# Patient Record
Sex: Female | Born: 1935 | Race: White | Hispanic: No | Smoking: Never smoker
Health system: Southern US, Community
[De-identification: ages and names within clinical notes are randomized; demographics above are authoritative.]

## PROBLEM LIST (undated history)

## (undated) DIAGNOSIS — D696 Thrombocytopenia, unspecified: Secondary | ICD-10-CM

## (undated) DIAGNOSIS — R000 Tachycardia, unspecified: Secondary | ICD-10-CM

## (undated) DIAGNOSIS — O2230 Deep phlebothrombosis in pregnancy, unspecified trimester: Secondary | ICD-10-CM

## (undated) DIAGNOSIS — M549 Dorsalgia, unspecified: Secondary | ICD-10-CM

## (undated) DIAGNOSIS — G8929 Other chronic pain: Secondary | ICD-10-CM

## (undated) DIAGNOSIS — N390 Urinary tract infection, site not specified: Secondary | ICD-10-CM

## (undated) DIAGNOSIS — K5792 Diverticulitis of intestine, part unspecified, without perforation or abscess without bleeding: Secondary | ICD-10-CM

## (undated) DIAGNOSIS — M1990 Unspecified osteoarthritis, unspecified site: Secondary | ICD-10-CM

## (undated) DIAGNOSIS — Q782 Osteopetrosis: Secondary | ICD-10-CM

## (undated) DIAGNOSIS — I2699 Other pulmonary embolism without acute cor pulmonale: Secondary | ICD-10-CM

## (undated) HISTORY — PX: ABDOMINAL HYSTERECTOMY: SHX81

---

## 2017-06-04 ENCOUNTER — Emergency Department (HOSPITAL_BASED_OUTPATIENT_CLINIC_OR_DEPARTMENT_OTHER): Payer: Medicare Other

## 2017-06-04 ENCOUNTER — Emergency Department (HOSPITAL_BASED_OUTPATIENT_CLINIC_OR_DEPARTMENT_OTHER)
Admission: EM | Admit: 2017-06-04 | Discharge: 2017-06-05 | Disposition: A | Discharge status: Home / Self Care | Payer: Medicare Other | Attending: Emergency Medicine | Admitting: Emergency Medicine

## 2017-06-04 ENCOUNTER — Encounter (HOSPITAL_BASED_OUTPATIENT_CLINIC_OR_DEPARTMENT_OTHER): Payer: Self-pay | Admitting: Emergency Medicine

## 2017-06-04 DIAGNOSIS — Z86711 Personal history of pulmonary embolism: Secondary | ICD-10-CM | POA: Insufficient documentation

## 2017-06-04 DIAGNOSIS — R0602 Shortness of breath: Secondary | ICD-10-CM | POA: Insufficient documentation

## 2017-06-04 DIAGNOSIS — Z79899 Other long term (current) drug therapy: Secondary | ICD-10-CM | POA: Insufficient documentation

## 2017-06-04 DIAGNOSIS — D72819 Decreased white blood cell count, unspecified: Secondary | ICD-10-CM

## 2017-06-04 DIAGNOSIS — R0789 Other chest pain: Secondary | ICD-10-CM | POA: Diagnosis not present

## 2017-06-04 HISTORY — DX: Dorsalgia, unspecified: M54.9

## 2017-06-04 HISTORY — DX: Other chronic pain: G89.29

## 2017-06-04 HISTORY — DX: Unspecified osteoarthritis, unspecified site: M19.90

## 2017-06-04 HISTORY — DX: Tachycardia, unspecified: R00.0

## 2017-06-04 HISTORY — DX: Osteopetrosis: Q78.2

## 2017-06-04 HISTORY — DX: Diverticulitis of intestine, part unspecified, without perforation or abscess without bleeding: K57.92

## 2017-06-04 HISTORY — DX: Urinary tract infection, site not specified: N39.0

## 2017-06-04 HISTORY — DX: Thrombocytopenia, unspecified: D69.6

## 2017-06-04 HISTORY — DX: Other pulmonary embolism without acute cor pulmonale: I26.99

## 2017-06-04 HISTORY — DX: Deep phlebothrombosis in pregnancy, unspecified trimester: O22.30

## 2017-06-04 LAB — BASIC METABOLIC PANEL
ANION GAP: 7 (ref 5–15)
BUN: 18 mg/dL (ref 6–20)
CALCIUM: 10.2 mg/dL (ref 8.9–10.3)
CO2: 29 mmol/L (ref 22–32)
Chloride: 103 mmol/L (ref 101–111)
Creatinine, Ser: 1.01 mg/dL — ABNORMAL HIGH (ref 0.44–1.00)
GFR, EST AFRICAN AMERICAN: 56 mL/min — AB (ref >60)
GFR, EST NON AFRICAN AMERICAN: 48 mL/min — AB (ref >60)
Glucose, Bld: 99 mg/dL (ref 65–99)
Potassium: 4.5 mmol/L (ref 3.5–5.1)
SODIUM: 139 mmol/L (ref 135–145)

## 2017-06-04 LAB — CBC WITH DIFFERENTIAL/PLATELET
BASOS ABS: 0 10*3/uL (ref 0.0–0.1)
Basophils Relative: 0 %
EOS ABS: 0 10*3/uL (ref 0.0–0.7)
EOS PCT: 1 %
HCT: 39.7 % (ref 36.0–46.0)
Hemoglobin: 13.2 g/dL (ref 12.0–15.0)
Lymphocytes Relative: 23 %
Lymphs Abs: 0.6 10*3/uL — ABNORMAL LOW (ref 0.7–4.0)
MCH: 31.4 pg (ref 26.0–34.0)
MCHC: 33.2 g/dL (ref 30.0–36.0)
MCV: 94.3 fL (ref 78.0–100.0)
MONO ABS: 0.4 10*3/uL (ref 0.1–1.0)
MONOS PCT: 13 %
NEUTROS ABS: 1.8 10*3/uL (ref 1.7–7.7)
Neutrophils Relative %: 63 %
PLATELETS: 151 10*3/uL (ref 150–400)
RBC: 4.21 MIL/uL (ref 3.87–5.11)
RDW: 13.1 % (ref 11.5–15.5)
WBC: 2.8 10*3/uL — ABNORMAL LOW (ref 4.0–10.5)

## 2017-06-04 LAB — TROPONIN I: Troponin I: <0.03 ng/mL (ref <0.03)

## 2017-06-04 MED ORDER — IOPAMIDOL (ISOVUE-370) INJECTION 76%
80.0000 mL | Freq: Once | INTRAVENOUS | Status: AC | PRN
  Administered 2017-06-04: 80 mL via INTRAVENOUS

## 2017-06-04 NOTE — ED Provider Notes (Signed)
[facility] Note   CSN: [id number] Arrival date & time: 06/04/17  1759     History   Chief Complaint Chief Complaint  Patient presents with  . Shortness of Breath    HPI Laura Hoffman is a 83 y.o. female.  Patient with complaint of shortness of breath increased with exertion. Patient remotely his had a history of deep vein thrombosis. No longer on anticoagulation therapy. Patient referred in by primary care doctor for concern for possible PE. Patient's also for the last several days has had intermittent left-sided chest pain that radiates to the back between the shoulder blades. Pain lasts less than 15 minutes. Patient denies any leg swelling. Patient denies any syncope. Past medical history indicates the patient had a pulmonary embolus in the past. But the family is not certain of that.      Past Medical History:  Diagnosis Date  . Chronic back pain   . Diverticulitis   . DJD (degenerative joint disease)   . DVT (deep vein thrombosis) in pregnancy (HCC)   . Osteopetrosis   . Pulmonary emboli (HCC)   . Tachycardia   . Thrombocytopenia (HCC)   . UTI (urinary tract infection)     There are no active problems to display for this patient.   Past Surgical History:  Procedure Laterality Date  . ABDOMINAL HYSTERECTOMY      OB History    No data available       Home Medications    Prior to Admission medications   Medication Sig Start Date End Date Taking? Authorizing Provider  cetirizine (ZYRTEC) 10 MG tablet Take 10 mg by mouth daily.   Yes [provider]  meclizine (ANTIVERT) 25 MG tablet Take 25 mg by mouth 3 (three) times daily as needed for dizziness.   Yes [provider]    Family History History reviewed. No pertinent family history.  Social History Social History  Substance Use Topics  . Smoking status: Never Smoker  . Smokeless tobacco: Never Used  . Alcohol use No     Allergies   Actonel [risedronate  sodium]; Augmentin [amoxicillin-pot clavulanate]; Ciprofloxacin; Codeine; Fosamax [alendronate sodium]; Penicillins; and Septra [sulfamethoxazole-trimethoprim]   Review of Systems Review of Systems  Constitutional: Positive for fatigue. Negative for fever.  HENT: Negative for congestion.   Eyes: Negative for redness.  Respiratory: Positive for shortness of breath.   Cardiovascular: Positive for chest pain. Negative for leg swelling.  Gastrointestinal: Negative for abdominal pain, nausea and vomiting.  Genitourinary: Negative for dysuria.  Musculoskeletal: Positive for back pain. Negative for neck pain.  Skin: Negative for rash.  Neurological: Negative for syncope and headaches.  Hematological: Does not bruise/bleed easily.  Psychiatric/Behavioral: Negative for confusion.     Physical Exam Updated Vital Signs BP (!) 135/54   Pulse 64   Temp 98.5 F (36.9 C) (Oral)   Resp 20   Ht 1.676 m ( )   Wt 47.2 kg (104 lb)   SpO2 95%   BMI 16.79 kg/m   Physical Exam  Constitutional: She is oriented to person, place, and time. She appears well-developed and well-nourished. No distress.  HENT:  Head: Normocephalic and atraumatic.  Mouth/Throat: Oropharynx is clear and moist.  Eyes: Pupils are equal, round, and reactive to light. Conjunctivae and EOM are normal.  Neck: Normal range of motion. Neck supple.  Cardiovascular: Normal rate, regular rhythm and normal heart sounds.   Pulmonary/Chest: Effort normal and breath sounds normal. No respiratory distress. She  has no wheezes.  Abdominal: Soft. Bowel sounds are normal. There is no tenderness.  Musculoskeletal: Normal range of motion. She exhibits no edema.  Left arm with area of redness. At site of flu shot from 2 days ago.  Neurological: She is alert and oriented to person, place, and time. No cranial nerve deficit or sensory deficit. She exhibits normal muscle tone. Coordination normal.  Skin: Skin is warm. There is erythema.    Nursing note and vitals reviewed.    ED Treatments / Results  Labs (all labs ordered are listed, but only abnormal results are displayed) Labs Reviewed  CBC WITH DIFFERENTIAL/PLATELET - Abnormal; Notable for the following:       Result Value   WBC 2.8 (*)    Lymphs Abs 0.6 (*)    All other components within normal limits  BASIC METABOLIC PANEL - Abnormal; Notable for the following:    Creatinine, Ser 1.01 (*)    GFR calc non Af Amer 48 (*)    GFR calc Af Amer 56 (*)    All other components within normal limits  TROPONIN I    EKG  EKG Interpretation  Date/Time:  Thursday June 04 2017 19:19:14 EDT Ventricular Rate:  68 PR Interval:    QRS Duration: 96 QT Interval:  417 QTC Calculation: 444 R Axis:   76 Text Interpretation:  Sinus rhythm RSR' in V1 or V2, probably normal variant Probable left ventricular hypertrophy Baseline wander in lead(s) V3 No previous ECGs available Confirmed by Vanetta Mulders [phone]) on 06/04/2017 8:09:54 PM       Radiology Dg Chest 2 View  Result Date: 06/04/2017 CLINICAL DATA:  Chronic cough for several months. Shortness of breath and chest pain. EXAM: CHEST  2 VIEW COMPARISON:  05/22/2010 FINDINGS: The heart size and mediastinal contours are within normal limits. Aortic atherosclerosis. Changes of COPD are again noted. Bilateral upper lobe and apical pleural-parenchymal scarring remain stable. No evidence of acute infiltrate or edema. No evidence of pneumothorax or pleural effusion. Old right lower rib fracture deformity incidentally noted. IMPRESSION: COPD.  No active disease. Electronically Signed   By: Myles Rosenthal M.D.   On: 06/04/2017 19:47   Ct Angio Chest Pe W/cm &/or Wo Cm  Result Date: 06/04/2017 CLINICAL DATA:  Shortness of breath with walking EXAM: CT ANGIOGRAPHY CHEST WITH CONTRAST TECHNIQUE: Multidetector CT imaging of the chest was performed using the standard protocol during bolus administration of intravenous contrast.  Multiplanar CT image reconstructions and MIPs were obtained to evaluate the vascular anatomy. CONTRAST:  80 mL Isovue 370 intravenous COMPARISON:  Radiograph 06/12/2017 FINDINGS: Cardiovascular: Satisfactory opacification of the pulmonary arteries to the segmental level. No evidence of pulmonary embolism. Atherosclerotic calcifications of the aorta. No dissection or aneurysmal dilatation. Normal heart size. No pericardial effusion. Mediastinum/Nodes: Mild tracheal deviation to the right from tortuous aortic arch. No thyroid mass. No significantly enlarged mediastinal lymph nodes. Esophagus within normal limits. Lungs/Pleura: No acute consolidation or pleural effusion. No pneumothorax. Biapical scarring. Upper Abdomen: Surgical clips at the gallbladder fossa. Punctate stone in the upper pole of the left kidney. Partially visualized low-density lesion mid left kidney, incompletely visualized. 14 mm cyst at the dome of the liver. Musculoskeletal: No acute or suspicious bone lesion Review of the MIP images confirms the above findings. IMPRESSION: 1. Negative for acute pulmonary embolus or aortic dissection 2. Biapical pleural and parenchymal scarring. Negative for infiltrate 3. Punctate nonobstructing stone in the upper pole of the left kidney. Aortic Atherosclerosis (ICD10-I70.0). Electronically  Signed   By: Jasmine Pang M.D.   On: 06/04/2017 22:24    Procedures Procedures (including critical care time)  Medications Ordered in ED Medications  iopamidol (ISOVUE-370) 76 % injection 80 mL (80 mLs Intravenous Contrast Given 06/04/17 2156)     Initial Impression / Assessment and Plan / ED Course  I have reviewed the triage vital signs and the nursing notes.  Pertinent labs & imaging results that were available during my care of the patient were reviewed by me and considered in my medical decision making (see chart for details).    Patient referred in by primary care doctor for concerns for possible  pulmonary embolus. Patient's had a history of DVT in the past. But not recently. Not on anticoagulation therapy now. Patient's had some mild increase of shortness of breath with walking. Also some intermittent left-sided anterior chest pain that last only minutes. That's been ongoing for several days to weeks. There is some pain that radiates between the shoulder blades that occurred yesterday. Patient has no lower extremity swelling.  CT angiogram of the chest shows no evidence of pulmonary embolus. Patient's troponin was negative. Patient's chest x-ray raises some concern for COPD. Patient without any hypoxia here. Oxygen saturation been in the upper 90s on room air. EKG without any acute changes.  Patient comfortable here. Workup without any acute findings. Patient stable for discharge home and follow-up with primary care doctor.  In addition patient has some redness to her left arm at the site of a flu shot that appears to be like a local block inflammatory reaction. No evidence of any significant infection.  Patient has a known history of low white blood cell count. According to family today's leukopenia is somewhat baseline. Her primary care doctor is aware of this.  Final Clinical Impressions(s) / ED Diagnoses   Final diagnoses:  SOB (shortness of breath)  Leukopenia, unspecified type    New Prescriptions New Prescriptions   No medications on file     Vanetta Mulders, MD 06/05/17 0009

## 2017-06-04 NOTE — Discharge Instructions (Signed)
Workup here today shows no evidence of any pulmonary embolus or any acute lung process. Does show some evidence of some COPD. But no active wheezing or hypoxia here today. Make appointment to follow-up with your doctor. As we discussed white blood cell count is low but sounds like that's baseline for you. Return for any new or worse symptoms.

## 2017-06-04 NOTE — ED Notes (Signed)
ED Provider at bedside. 

## 2017-06-04 NOTE — ED Triage Notes (Signed)
Patient went to her primary dr for Atypical Chest pain  - they sent her here for a R/O PE  - family reports that she is having Dysnea with walking

## 2018-12-01 IMAGING — CT CT ANGIO CHEST
3 of 6 series · 14 of 36 positions shown · IV contrast (isovue)
Comparison: Radiograph 06/12/2017

CLINICAL DATA: Shortness of breath with walking

EXAM:
CT ANGIOGRAPHY CHEST WITH CONTRAST
TECHNIQUE: Multidetector CT imaging of the chest was performed using the
standard protocol during bolus administration of intravenous
contrast. Multiplanar CT image reconstructions and MIPs were
obtained to evaluate the vascular anatomy.
CONTRAST:  80 mL Isovue 370 intravenous

[Series 5: pe axial st · axial · 0.58mm/px · z∈[+1220,+1421]mm · 5 of 101 slices shown]
[im 17/101  lung]
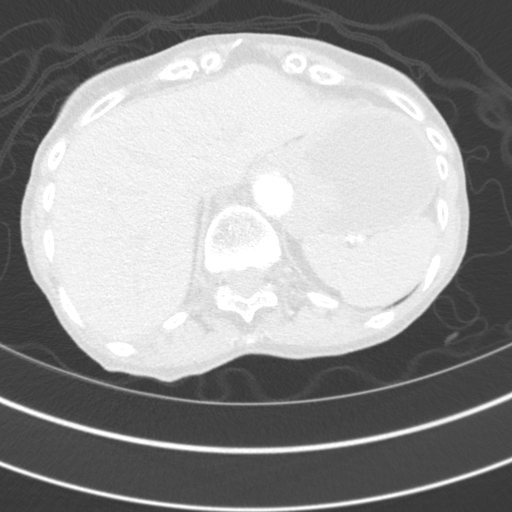
[im 34/101  mediastinal]
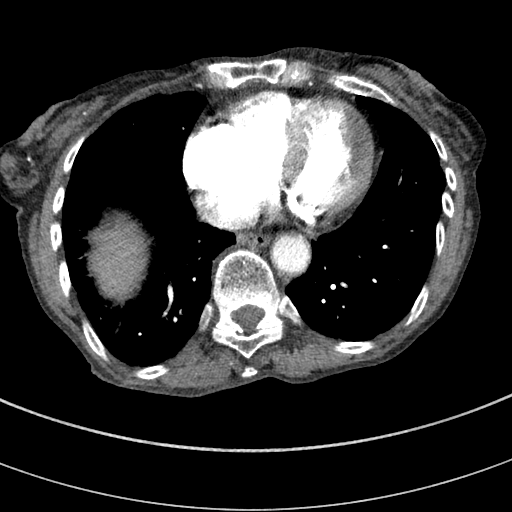
[im 51/101  lung]
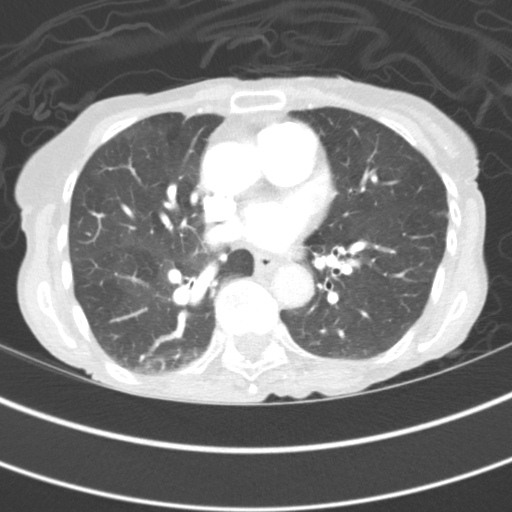
[im 67/101  mediastinal]
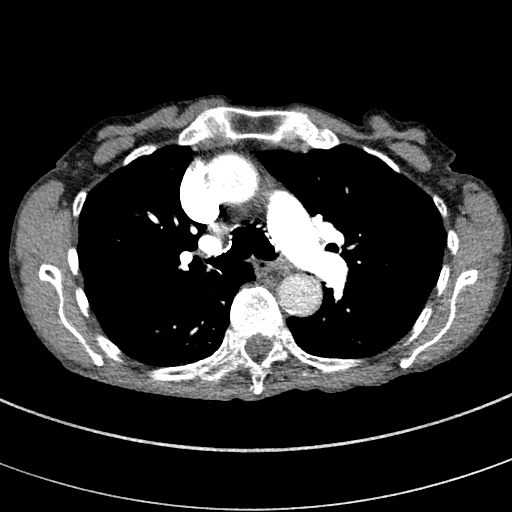
[im 84/101  lung]
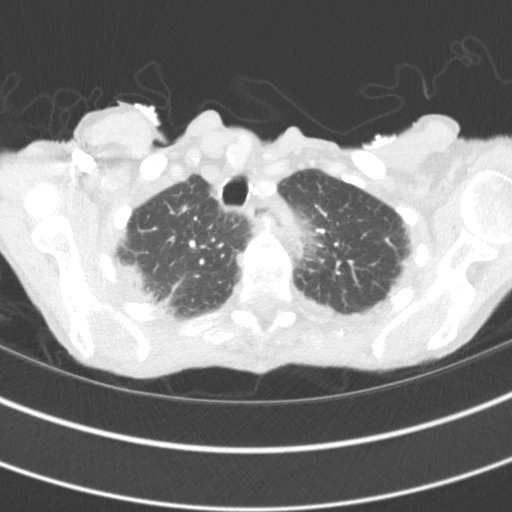

[Series 7: pe coronal mpr · coronal · 0.59mm/px · 1 of 95 slices shown]
[im 48/95  mediastinal]
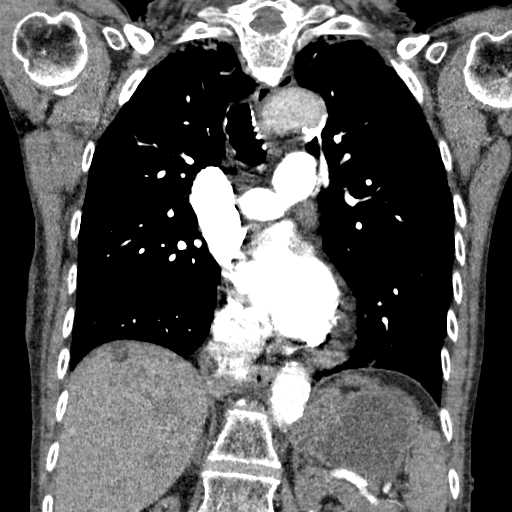

[Series 10: pe thins · axial · 0.58mm/px · z∈[+1220,+1444]mm · 8 of 280 slices shown]
[im 28/280  lung]
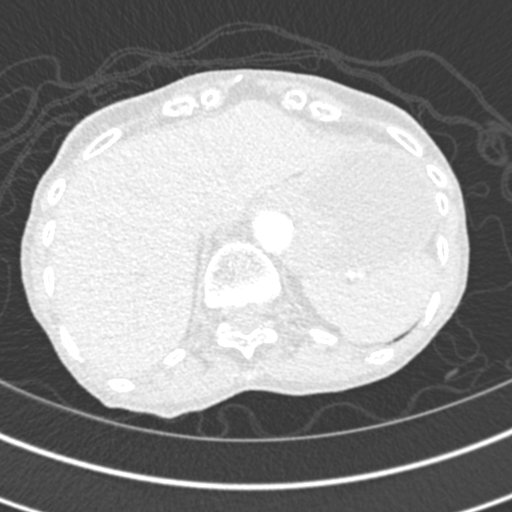
[im 56/280  lung]
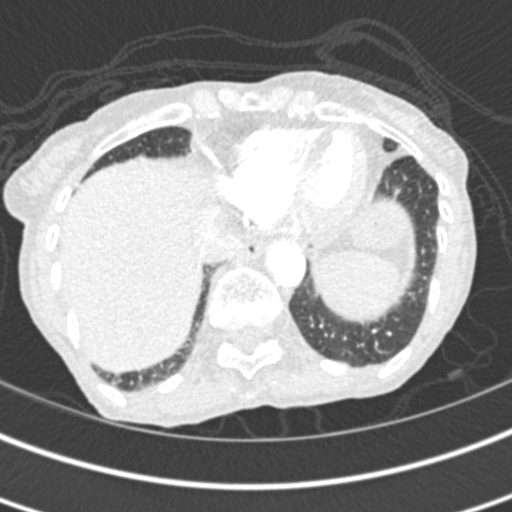
[im 84/280  lung]
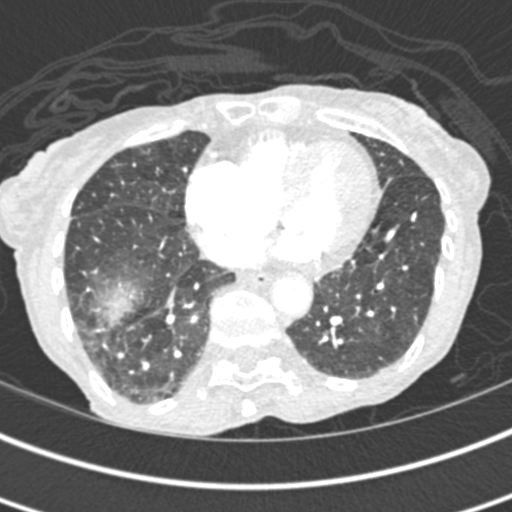
[im 126/280  lung]
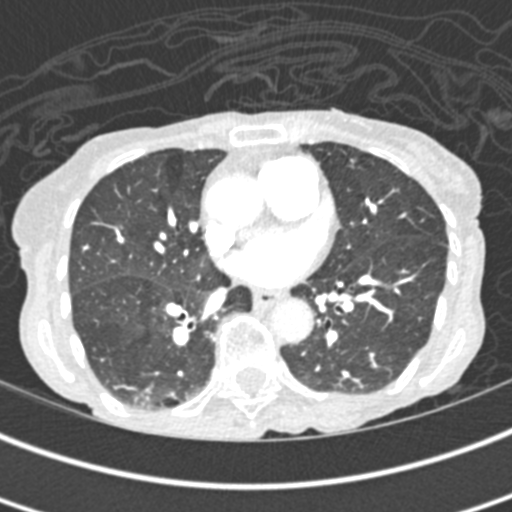
[im 154/280  lung]
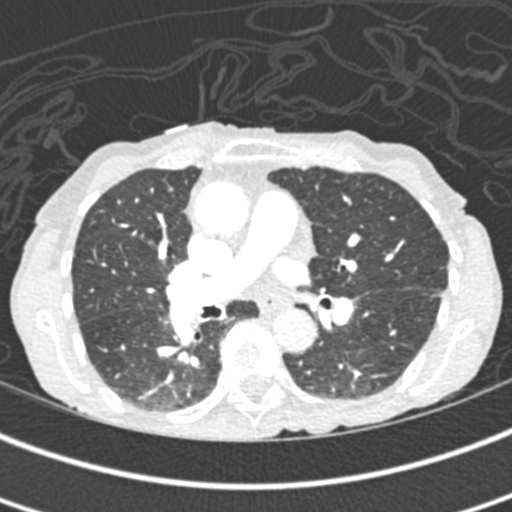
[im 196/280  lung]
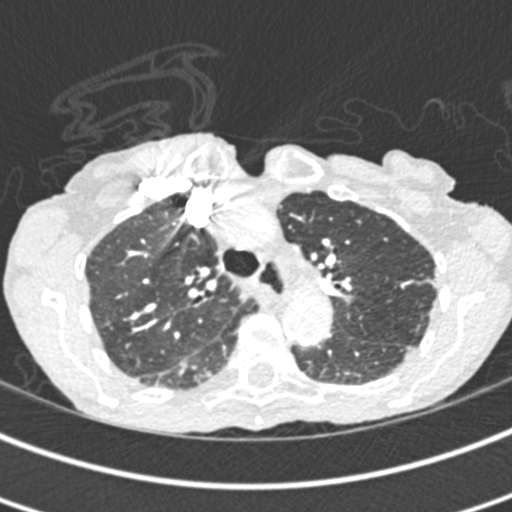
[im 224/280  lung]
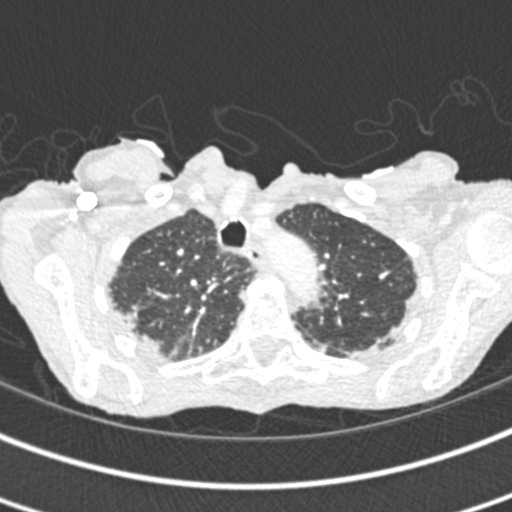
[im 252/280  lung]
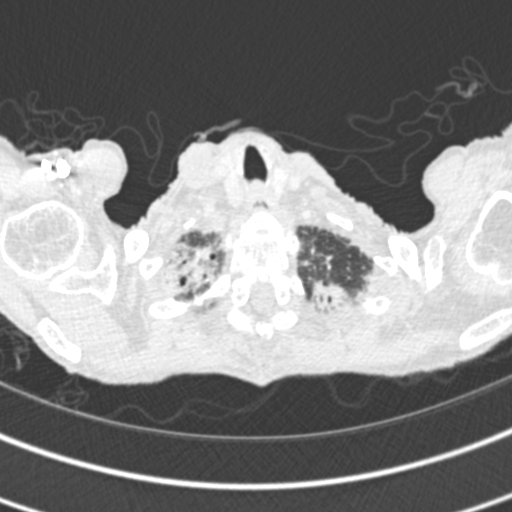

[14 of 36 positions shown; findings below may reference images not displayed]

FINDINGS: Cardiovascular: Satisfactory opacification of the pulmonary arteries
to the segmental level. No evidence of pulmonary embolism.
Atherosclerotic calcifications of the aorta. No dissection or
aneurysmal dilatation. Normal heart size. No pericardial effusion.

Mediastinum/Nodes: Mild tracheal deviation to the right from
tortuous aortic arch. No thyroid mass. No significantly enlarged
mediastinal lymph nodes. Esophagus within normal limits.

Lungs/Pleura: No acute consolidation or pleural effusion. No
pneumothorax. Biapical scarring.

Upper Abdomen: Surgical clips at the gallbladder fossa. Punctate
stone in the upper pole of the left kidney. Partially visualized
low-density lesion mid left kidney, incompletely visualized. 14 mm
cyst at the dome of the liver.

Musculoskeletal: No acute or suspicious bone lesion

Review of the MIP images confirms the above findings.
IMPRESSION: 1. Negative for acute pulmonary embolus or aortic dissection
2. Biapical pleural and parenchymal scarring. Negative for
infiltrate
3. Punctate nonobstructing stone in the upper pole of the left
kidney.

Aortic Atherosclerosis ([id number]-[id number]).

## 2024-11-13 DEATH — deceased
